# Patient Record
Sex: Female | Born: 1970 | Race: Black or African American | Hispanic: No | Marital: Married
Health system: Southern US, Community
[De-identification: ages and names within clinical notes are randomized; demographics above are authoritative.]

---

## 2007-03-01 ENCOUNTER — Emergency Department (HOSPITAL_COMMUNITY): Admission: EM | Admit: 2007-03-01 | Discharge: 2007-03-01 | Payer: Self-pay | Admitting: Emergency Medicine

## 2007-06-10 ENCOUNTER — Ambulatory Visit: Payer: Self-pay | Admitting: Internal Medicine

## 2007-06-10 ENCOUNTER — Encounter (INDEPENDENT_AMBULATORY_CARE_PROVIDER_SITE_OTHER): Payer: Self-pay | Admitting: Nurse Practitioner

## 2007-06-10 DIAGNOSIS — J019 Acute sinusitis, unspecified: Secondary | ICD-10-CM

## 2007-06-10 DIAGNOSIS — H6120 Impacted cerumen, unspecified ear: Secondary | ICD-10-CM

## 2007-06-10 DIAGNOSIS — M1710 Unilateral primary osteoarthritis, unspecified knee: Secondary | ICD-10-CM

## 2007-06-20 ENCOUNTER — Ambulatory Visit: Payer: Self-pay | Admitting: Internal Medicine

## 2007-06-20 LAB — CONVERTED CEMR LAB
AST: 9 units/L (ref 0–37)
Albumin: 4.2 g/dL (ref 3.5–5.2)
Alkaline Phosphatase: 59 units/L (ref 39–117)
Bilirubin Urine: NEGATIVE
Glucose, Urine, Semiquant: NEGATIVE
HDL: 47 mg/dL (ref >39)
Ketones, urine, test strip: NEGATIVE
LDL Cholesterol: 90 mg/dL (ref 0–99)
Potassium: 3.8 meq/L (ref 3.5–5.3)
Sodium: 138 meq/L (ref 135–145)
Specific Gravity, Urine: 1.01
TSH: 1.802 microintl units/mL (ref 0.350–5.50)
Total Bilirubin: 0.7 mg/dL (ref 0.3–1.2)
Total Protein: 7.4 g/dL (ref 6.0–8.3)
VLDL: 17 mg/dL (ref 0–40)

## 2007-07-04 ENCOUNTER — Ambulatory Visit: Payer: Self-pay | Admitting: Internal Medicine

## 2007-07-04 DIAGNOSIS — K2990 Gastroduodenitis, unspecified, without bleeding: Secondary | ICD-10-CM

## 2007-07-04 DIAGNOSIS — K2970 Gastritis, unspecified, without bleeding: Secondary | ICD-10-CM | POA: Insufficient documentation

## 2007-11-14 ENCOUNTER — Ambulatory Visit: Payer: Self-pay | Admitting: Nurse Practitioner

## 2007-11-14 DIAGNOSIS — H5710 Ocular pain, unspecified eye: Secondary | ICD-10-CM | POA: Insufficient documentation

## 2007-11-14 DIAGNOSIS — K219 Gastro-esophageal reflux disease without esophagitis: Secondary | ICD-10-CM

## 2007-11-14 LAB — CONVERTED CEMR LAB
ALT: <8 units/L (ref 0–35)
AST: 7 units/L (ref 0–37)
Albumin: 4.3 g/dL (ref 3.5–5.2)
Alkaline Phosphatase: 44 units/L (ref 39–117)
Basophils Absolute: 0 10*3/uL (ref 0.0–0.1)
Beta hcg, urine, semiquantitative: POSITIVE
Eosinophils Absolute: 0 10*3/uL (ref 0.0–0.7)
Eosinophils Relative: 0 % (ref 0–5)
Glucose, Bld: 87 mg/dL (ref 70–99)
Lymphs Abs: 1.3 10*3/uL (ref 0.7–4.0)
MCV: 83.9 fL (ref 78.0–100.0)
Neutrophils Relative %: 61 % (ref 43–77)
Platelets: 157 10*3/uL (ref 150–400)
Potassium: 4.7 meq/L (ref 3.5–5.3)
RDW: 13.8 % (ref 11.5–15.5)
Sodium: 137 meq/L (ref 135–145)
Total Bilirubin: 0.5 mg/dL (ref 0.3–1.2)
Total Protein: 7.5 g/dL (ref 6.0–8.3)
WBC: 4 10*3/uL (ref 4.0–10.5)
hCG, Beta Chain, Quant, S: 88093.5 milliintl units/mL

## 2007-11-19 ENCOUNTER — Telehealth (INDEPENDENT_AMBULATORY_CARE_PROVIDER_SITE_OTHER): Payer: Self-pay | Admitting: Nurse Practitioner

## 2007-11-21 ENCOUNTER — Telehealth (INDEPENDENT_AMBULATORY_CARE_PROVIDER_SITE_OTHER): Payer: Self-pay | Admitting: Nurse Practitioner

## 2007-11-28 ENCOUNTER — Inpatient Hospital Stay (HOSPITAL_COMMUNITY): Admission: AD | Admit: 2007-11-28 | Discharge: 2007-11-28 | Payer: Self-pay | Admitting: Obstetrics and Gynecology

## 2007-11-29 ENCOUNTER — Ambulatory Visit (HOSPITAL_COMMUNITY): Admission: AD | Admit: 2007-11-29 | Discharge: 2007-11-29 | Payer: Self-pay | Admitting: Obstetrics and Gynecology

## 2007-11-29 ENCOUNTER — Encounter (INDEPENDENT_AMBULATORY_CARE_PROVIDER_SITE_OTHER): Payer: Self-pay | Admitting: Obstetrics and Gynecology

## 2008-05-08 ENCOUNTER — Encounter (INDEPENDENT_AMBULATORY_CARE_PROVIDER_SITE_OTHER): Payer: Self-pay | Admitting: Obstetrics and Gynecology

## 2008-05-08 ENCOUNTER — Ambulatory Visit (HOSPITAL_COMMUNITY): Admission: RE | Admit: 2008-05-08 | Discharge: 2008-05-08 | Payer: Self-pay | Admitting: Obstetrics and Gynecology

## 2009-01-08 ENCOUNTER — Ambulatory Visit: Payer: Self-pay | Admitting: Nurse Practitioner

## 2009-01-08 DIAGNOSIS — R05 Cough: Secondary | ICD-10-CM

## 2009-01-08 DIAGNOSIS — E669 Obesity, unspecified: Secondary | ICD-10-CM | POA: Insufficient documentation

## 2009-01-08 DIAGNOSIS — L989 Disorder of the skin and subcutaneous tissue, unspecified: Secondary | ICD-10-CM | POA: Insufficient documentation

## 2009-01-11 ENCOUNTER — Ambulatory Visit: Payer: Self-pay | Admitting: *Deleted

## 2009-01-27 ENCOUNTER — Ambulatory Visit: Payer: Self-pay | Admitting: Nurse Practitioner

## 2009-01-27 DIAGNOSIS — N926 Irregular menstruation, unspecified: Secondary | ICD-10-CM

## 2009-01-27 DIAGNOSIS — M545 Low back pain: Secondary | ICD-10-CM

## 2009-01-27 LAB — CONVERTED CEMR LAB
Basophils Absolute: 0 10*3/uL (ref 0.0–0.1)
Basophils Relative: 1 % (ref 0–1)
Eosinophils Absolute: 0.4 10*3/uL (ref 0.0–0.7)
Eosinophils Relative: 7 % — ABNORMAL HIGH (ref 0–5)
HCT: 37 % (ref 36.0–46.0)
Hemoglobin: 12.7 g/dL (ref 12.0–15.0)
MCHC: 34.3 g/dL (ref 30.0–36.0)
MCV: 84.5 fL (ref 78.0–100.0)
Monocytes Absolute: 0.4 10*3/uL (ref 0.1–1.0)
Platelets: 152 10*3/uL (ref 150–400)
RDW: 12.7 % (ref 11.5–15.5)

## 2009-01-28 ENCOUNTER — Encounter (INDEPENDENT_AMBULATORY_CARE_PROVIDER_SITE_OTHER): Payer: Self-pay | Admitting: Nurse Practitioner

## 2009-04-22 ENCOUNTER — Ambulatory Visit: Payer: Self-pay | Admitting: Nurse Practitioner

## 2009-04-22 LAB — CONVERTED CEMR LAB
Bilirubin Urine: NEGATIVE
Nitrite: NEGATIVE
Protein, U semiquant: 30
Specific Gravity, Urine: 1.015
Urobilinogen, UA: 1
pH: 7

## 2009-04-26 ENCOUNTER — Ambulatory Visit: Payer: Self-pay | Admitting: Nurse Practitioner

## 2009-04-28 ENCOUNTER — Ambulatory Visit: Payer: Self-pay | Admitting: Nurse Practitioner

## 2009-05-14 ENCOUNTER — Encounter (INDEPENDENT_AMBULATORY_CARE_PROVIDER_SITE_OTHER): Payer: Self-pay | Admitting: Nurse Practitioner

## 2009-06-14 ENCOUNTER — Telehealth (INDEPENDENT_AMBULATORY_CARE_PROVIDER_SITE_OTHER): Payer: Self-pay | Admitting: Nurse Practitioner

## 2009-07-07 ENCOUNTER — Encounter (INDEPENDENT_AMBULATORY_CARE_PROVIDER_SITE_OTHER): Payer: Self-pay | Admitting: Nurse Practitioner

## 2010-08-24 ENCOUNTER — Emergency Department (HOSPITAL_COMMUNITY): Admission: EM | Admit: 2010-08-24 | Discharge: 2010-08-24 | Payer: Self-pay | Admitting: Emergency Medicine

## 2011-02-28 NOTE — Op Note (Signed)
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   OPERATION:  Cold-knife conization of the cervix.   SURGEON:  Hal Morales, MD   ANESTHESIA:  General orotracheal.   ESTIMATED BLOOD LOSS:  Less than 10 mL.   COMPLICATIONS:  None.   FINDINGS:  The cervix appeared normal.  The uterus sounded to 7 cm.  There was a minimal Lugol nonstaining area at 12 o'clock at the external  os.   SPECIMENS TO PATHOLOGY:  Cold-knife cone, endocervical curettings, and  endometrial curettings.   PROCEDURE:  The patient was taken to the operating room after  appropriate identification and after a discussion of the risks and  benefits as well as indications of the procedure via the [place]  language line with interpreter number [phone].  She was placed on the  operating table and after the attainment of adequate general anesthesia  was placed in the lithotomy position.  The perineum and vagina were  prepped with multiple layers of Betadine and the bladder was emptied  with a straight catheter.  The perineum was draped as a sterile field.  A Graves speculum was placed in the vagina and a paracervical block  achieved with a total of 10 mL of 2% Xylocaine in the 5 and 7 o'clock  positions.  A single-tooth tenaculum was placed outside the transition  zone.  Lugol staining was undertaken with the above-noted findings.  Hemostatic sutures were placed at the 3 and 9 o'clock position and held.  The cervical cone was then excised including the  small nonstaining area  at 12 o'clock and the endocervical canal.  The cone was removed in 2  pieces.  A suture was placed at that 12 o'clock position for  identification and once the cone was removed and seen to be disrupted, 2  sutures were placed to reconnect the endocervical canal.  The  endocervical canal was then curetted and sent as a specimen.  The  endometrial canal was curetted in all quadrants and sent as a separate  specimen.  Sturmdorf sutures were placed at the 12 and 6 o'clock  positions.  Hemostasis was noted to be adequate.  A portion of Gelfoam  was placed in the conization bed.  The patient was awakened from general  anesthesia and taken to the recovery room in satisfactory condition  having tolerated the procedure well with sponge and instrument counts  correct.  Specimens to pathology were cold-knife cone, ECC, and  endometrial curettings.   DISCHARGE INSTRUCTIONS:  Home care instructions for conization from  [facility].   DISCHARGE MEDICATIONS:  1. Tylenol, over the counter, 650 mg q.4  h. p.r.n. pain.  2. Vicodin 1 to 2 p.o. q.4 h. p.r.n. severe pain.   FOLLOWUP INSTRUCTIONS:  The patient is to follow up in 2 weeks on June 03, 2008, at 3:00 p.m. with Dr. Pennie Rushing.      Hal Morales, M.D.  Electronically Signed     VPH/MEDQ  D:  05/08/2008  T:  05/09/2008  Job:  [phone]

## 2011-02-28 NOTE — Op Note (Signed)
Kaitlin Owens, Kaitlin Owens              ACCOUNT NO.:  [id number]   MEDICAL RECORD NO.:  [id number]          PATIENT TYPE:  MAT   LOCATION:  MATC                          [facility]  [facility]   PHYSICIAN:  Crist Fat. Rivard, M.D. DATE OF BIRTH:  11-Dec-1970   DATE OF PROCEDURE:  11/29/2007  DATE OF DISCHARGE:  11/29/2007                               OPERATIVE REPORT   PREOPERATIVE DIAGNOSIS:  Incomplete abortion.   POSTOPERATIVE DIAGNOSIS:  Incomplete abortion.   ANESTHESIA:  IV sedation and paracervical block, Dr. Harvest Forest, Dr. Estanislado Pandy.   PROCEDURE:  Dilatation and evacuation.   ESTIMATED BLOOD LOSS:  Minimal.   PROCEDURE:  Through an interpreter line, the patient was consented for a  D&E with risks and benefits reviewed and questions were answered.  She  was brought to the OR in room #3, placed in a lithotomy position,  prepped and draped in a sterile fashion after IV sedation was provided  to the patient.  Her bladder was emptied with an in-and-out Foley  catheter.  Pelvic exam reveals an anteverted uterus 9-10 weeks in size  with a flared cervical os two normal adnexa.   A weighted speculum is inserted.  Anterior lip of the cervix is grasped  with a tenaculum forceps and paracervical block is performed with 20 mL  of Novocain 1% in the usual fashion.  The cervix is dilated to easily  allow a Hegar dilator #35 and the uterus is sounded at 9 cm.  Using a #9  curved cannula we proceed with evacuation of products of conception from  the uterine cavity.  When the evacuation is complete a sharp curette is  used to assess the uterine cavity, which is now felt to be free of  tissue.   Instruments are removed, instrument and sponge count is complete x2, end  estimated blood loss is minimal.  The procedure is very well-tolerated  by the patient, who is taken to recovery room in a well and stable  condition.   SPECIMEN:  Products of conception sent to pathology.      Crist Fat Rivard, M.D.  Electronically Signed     SAR/MEDQ  D:  11/29/2007  T:  12/02/2007  Job:  [id number]

## 2011-02-28 NOTE — H&P (Signed)
Kaitlin, Owens              ACCOUNT NO.:  [id number]   MEDICAL RECORD NO.:  [id number]          PATIENT TYPE:  AMB   LOCATION:  SDC                           [facility]   PHYSICIAN:  Hal Morales, M.D.DATE OF BIRTH:  [birth year]   DATE OF ADMISSION:  DATE OF DISCHARGE:                              HISTORY & PHYSICAL   HISTORY OF PRESENT ILLNESS:  The patient is a 40 year old black female  para 9-2-0-1-7, who presents for evaluation and management of abnormal  genital cytology.  The patient had a Pap smear on February 03, 2008 that  showed epithelial cell abnormality with glandular cells, atypical  glandular cells not otherwise specified.  The patient underwent  colposcopically directed biopsies and endometrial biopsies showing an  exocervical biopsy at 12 o'clock with chronic cervicitis and reactive  changes and endocervical curetting with a small fragment of cervical  squamous mucosa with low grade squamous intraepithelial lesion, mild  dysplasia and endometrium with benign proliferative endometrium without  hyperplasia, atypia or malignancy.  The patient presents for conization  for this disparity.   PAST HISTORY:  GYNECOLOGIC: The patient underwent menarche at age 61 and  has menses every month lasting for about 5 days.  She had her last  menstrual period January 18, 2008 at which time an Implanon insertion was  done for contraception.  She had previously used Depo-Provera for  contraception.   OBSTETRICAL HISTORY:  The patient has seven living children, all from  vaginal delivery and has had two miscarriages, the most recent in  February 2009.   SURGICAL HISTORY:  D&C for spontaneous abortion.   FAMILY HISTORY:  Is negative.   MEDICAL HISTORY:  Is negative.   CURRENT MEDICATIONS:  None.   DRUG SENSITIVITIES:  None.   REVIEW OF SYSTEMS:  Is negative except as mentioned above.   PHYSICAL EXAMINATION:  The patient is a well-developed black female in  no acute  distress. Temperature 98.7, blood pressure 132/92, pulse 72,  respirations 14.  LUNGS:  Clear.  HEART: Regular rate and rhythm.  ABDOMEN: The abdomen is soft without masses or organomegaly.  EXTREMITIES:  No clubbing, cyanosis or edema.  PELVIC:  EG, BUS the last within normal limits.  The vagina is rugous.  The cervix is without gross lesions.  The uterus is normal size, shape,  consistency, mobile and nontender.  Adnexae no masses.  Rectovaginal no  masses.   IMPRESSION:  Abnormal genital cytology with a positive endocervical  curettage.   DISPOSITION:  A discussion is held with the patient via her Swahili  interpreter concerning the recommendation for cold knife conization of  the cervix D&C.  She wishes to proceed.  The risks of anesthesia,  bleeding, infection and damage to adjacent organs were explained via the  interpreter and this will be performed at [facility] on May 08, 2008.      Hal Morales, M.D.  Electronically Signed     VPH/MEDQ  D:  04/23/2008  T:  04/23/2008  Job:  [id number]

## 2011-02-28 NOTE — H&P (Signed)
Kaitlin, Owens              ACCOUNT NO.:  [id number]   MEDICAL RECORD NO.:  [id number]          PATIENT TYPE:  MAT   LOCATION:  MATC                          [facility]   PHYSICIAN:  Crist Fat. Rivard, M.D. DATE OF BIRTH:  08-Mar-1971   DATE OF ADMISSION:  11/29/2007  DATE OF DISCHARGE:                              HISTORY & PHYSICAL   The patient is a 40 year old married black female, gravida 9, para 8, 1-  0-7 at 79 and 4/7 weeks per an LMP of September 09, 2007, who presents  with onset of heavy vaginal bleeding this morning and increased lower  abdominal pain.  She reports having multiple clots.  She has no other  pain with the exception of a headache, which she had also had yesterday.  She denies any nausea, vomiting, diarrhea, UTI signs or symptoms, fever,  shortness of breath.  She last had solid food intake at approximately  8:00 p.m. last night as well as liquids at that time.  She reports that  she has had decreased appetite.  She was seen here at [facility] in the maternity admission unit yesterday for some spotting  and lower abdominal pain and was sent home after giving her a Vicodin  tablet x1.  She was scheduled for a D&E tomorrow, February 14, secondary  to a missed AB.  She had a new OB visit in the office, where the missed  AB was identified.  She had an ultrasound at that time showing no fetal  heart rate, crown rump length of 7-3/7 weeks and a gestational sac  measuring 9-1/7 weeks and irregular.  Her history is remarkable for, 1)  language barrier; she speaks Swahili.  She moved to the [place]  approximately one year ago, 2) advanced maternal age, 3) grand multip,  4) history of a neonatal death with one of her pregnancies; it was a  twin gestation with death of one of the twins at 38 day old, 5) history  of a questionable IUFD at 6 months versus preterm delivery and preterm  labor, 6) questionable rheumatoid arthritis.  The patient is  desiring  contraception, and she presented to the hospital via EMS with her  husband and one of their children.   PRENATAL LABORATORIES:  The patient's blood type is A positive, Rh  antibody screen negative, GC and chlamydia cultures negative, hepatitis  surface antigen negative, RPR nonreactive, rubella immune.  Her CBC on  February 10 showed a white count of 4.5, hemoglobin 12.1, hematocrit  35.7, platelets 150.  She is varicella immune.  HIV was nonreactive.  Her hemoglobin electrophoresis is pending.  The patient's CBC on  admission today:  White count was 5.5, hemoglobin 12.5, hematocrit 36,  and platelets were 139.   OBSTETRICAL HISTORY:  The patient had a spontaneous vaginal delivery  November 1990, female, unknown weight, term.  Had a spontaneous vaginal  delivery, G2, in May of 1992, unknown weight, term per patient.  The  patient was gravida 3 with twin gestation and spontaneous vaginal  delivery in April 1994.  One of the  twins died at 76 day old.  Gravida 4  was spontaneous vaginal delivery December 1998, unknown sex and weight.  Gravida 5 spontaneous vaginal delivery April 2000, again unknown weight  and sex.  Gravida 6 was spontaneous vaginal delivery in June of 2002,  unknown weight and sex.  Gravida 7 spontaneous vaginal delivery, female  infant, probably term in November 2006.  She has had one other pregnancy  that was either a preterm delivery at 6 months or an IUFD, and then  gravida 9 is the current pregnancy.   PAST MEDICAL HISTORY:  The patient with anemia with all her pregnancies.  As mentioned, had a 17-day-old twin that died in 25.  She reports  postpartum hemorrhage with her first and fourth deliveries, questionable  possible preterm rupture of membranes with her second pregnancy.  She  has used Depo-Provera in the past, last injection was in 2006.  Varicella:  She was not sure that she had had it but she is immune.  She  reports seeing a physician in [place]  who told her that she may have  rheumatoid arthritis secondary to joint pain for almost 5 years.   FAMILY HISTORY:  Unknown.   GENETIC HISTORY:  Remarkable for patient being advanced maternal age at  41 years of age.  Father of the baby 37 years old.   SOCIAL HISTORY:  Married black female, speaking Swahili.  They report  Pentecostal faith.  She is a Futures trader.  The father of the baby reports  high school education, works for [employer] full-time.  The patient with  a 7th grade education.  She denies alcohol ,tobacco or illicit drug use.   HISTORY OF PRESENT PREGNANCY:  The patient seen on the 10th in the  office for a new OB visit, where a missed AB was identified.  She then  came into MAU on the 12th with some spotting and some lower abdominal  pain, which improved after Vicodin, and the patient was discharged home  with bleeding precautions and miscarriage signs and symptoms.  She  returned today, on the 13th of February, with a large amount of vaginal  bleeding and abdominal pain, and she presented EMS.   PHYSICAL EXAMINATION:  VITAL SIGNS ON ADMISSION:  Blood pressure 137/74,  pulse 110, respirations 18, temperature 98.2.  SKIN:  Warm, dry and intact.  HEENT:  Within normal limits.  CARDIOVASCULAR:  Regular rate and rhythm without murmur.  RESPIRATORY:  Lungs were clear to auscultation bilaterally.  ABDOMEN:  Soft.  Somewhat tender.  No guarding or rebound.  PELVIC:  Large amount of blood on speculum exam with several 4 to 5 cm  clots with some stringiness.  After that was cleared from the vault,  there was no active bleeding at the os, and the cervix was approximately  1 cm dilated.  On bimanual exam, no cervical motion tenderness.  EXTREMITIES:  Within normal limits.   The patient had an ultrasound after admission to the hospital showing no  intrauterine pregnancy visualized, heterogeneous material in endometrial  cavity with some internal blood flow suspicious for retained  products of  conception.  No adnexal mass or free fluid identified.   IMPRESSION:  1. Vaginal bleeding , status post missed abortion.  2. Pain in the lower abdomen.  3. An 11-4/7-week gestation.   PLAN:  Consulted with Dr. Estanislado Pandy regarding the patient's status and plan  to proceed with D&E secondary to possible retained products of  conception in the uterus status  post ultrasound.  The patient did have  an IV started in MAU.  She had a 500-mL LR bolus and it has been running  at 125 an hour.  She has been NPO since admission.  The patient was  consented for the procedure using the language line and was prepped for  OR.      Candice [place], CNM      Dois Davenport A. Rivard, M.D.  Electronically Signed   CHS/MEDQ  D:  11/29/2007  T:  11/29/2007  Job:  [id number]

## 2011-03-01 ENCOUNTER — Ambulatory Visit (HOSPITAL_COMMUNITY)
Admission: RE | Admit: 2011-03-01 | Discharge: 2011-03-01 | Disposition: A | Discharge status: Home / Self Care | Payer: Self-pay | Source: Ambulatory Visit | Attending: Internal Medicine | Admitting: Internal Medicine

## 2011-03-01 ENCOUNTER — Other Ambulatory Visit (HOSPITAL_COMMUNITY): Payer: Self-pay | Admitting: Family Medicine

## 2011-03-01 ENCOUNTER — Other Ambulatory Visit: Payer: Self-pay | Admitting: Internal Medicine

## 2011-03-01 DIAGNOSIS — R52 Pain, unspecified: Secondary | ICD-10-CM

## 2011-03-01 DIAGNOSIS — R05 Cough: Secondary | ICD-10-CM

## 2011-03-01 DIAGNOSIS — R059 Cough, unspecified: Secondary | ICD-10-CM | POA: Insufficient documentation

## 2011-07-07 LAB — CBC
HCT: 36
Hemoglobin: 12.5
MCHC: 34.6
Platelets: 139 — ABNORMAL LOW
RDW: 14

## 2011-07-14 LAB — CBC
MCHC: 32.7
MCV: 88.7
Platelets: 153
RBC: 4.89
WBC: 4.1

## 2011-07-14 LAB — PREGNANCY, URINE: Preg Test, Ur: NEGATIVE

## 2012-03-24 IMAGING — CR DG CHEST 2V
2 series · 2 of 2 positions shown · non-contrast
Comparison: 08/24/2010

CLINICAL DATA: Cough

CHEST - 2 VIEW

[w chest pa]
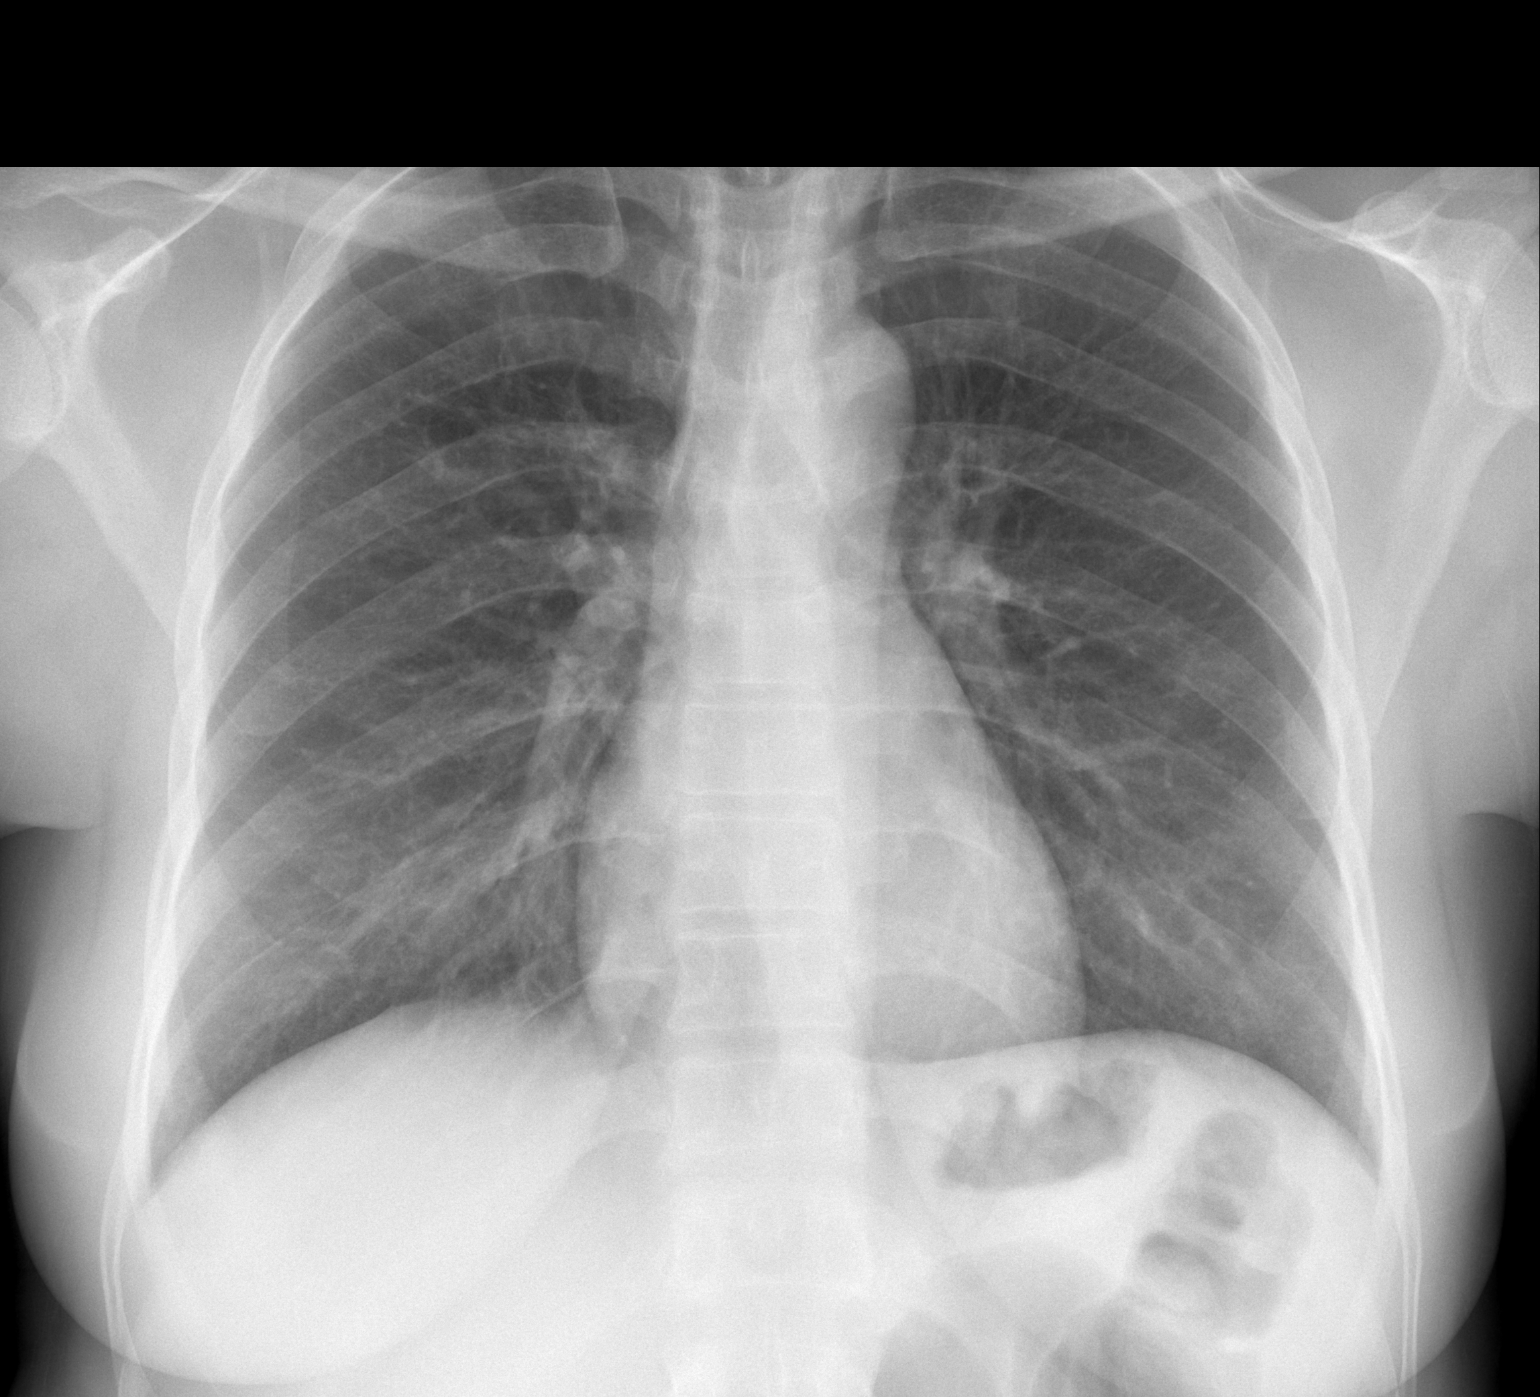

[w chest lat]
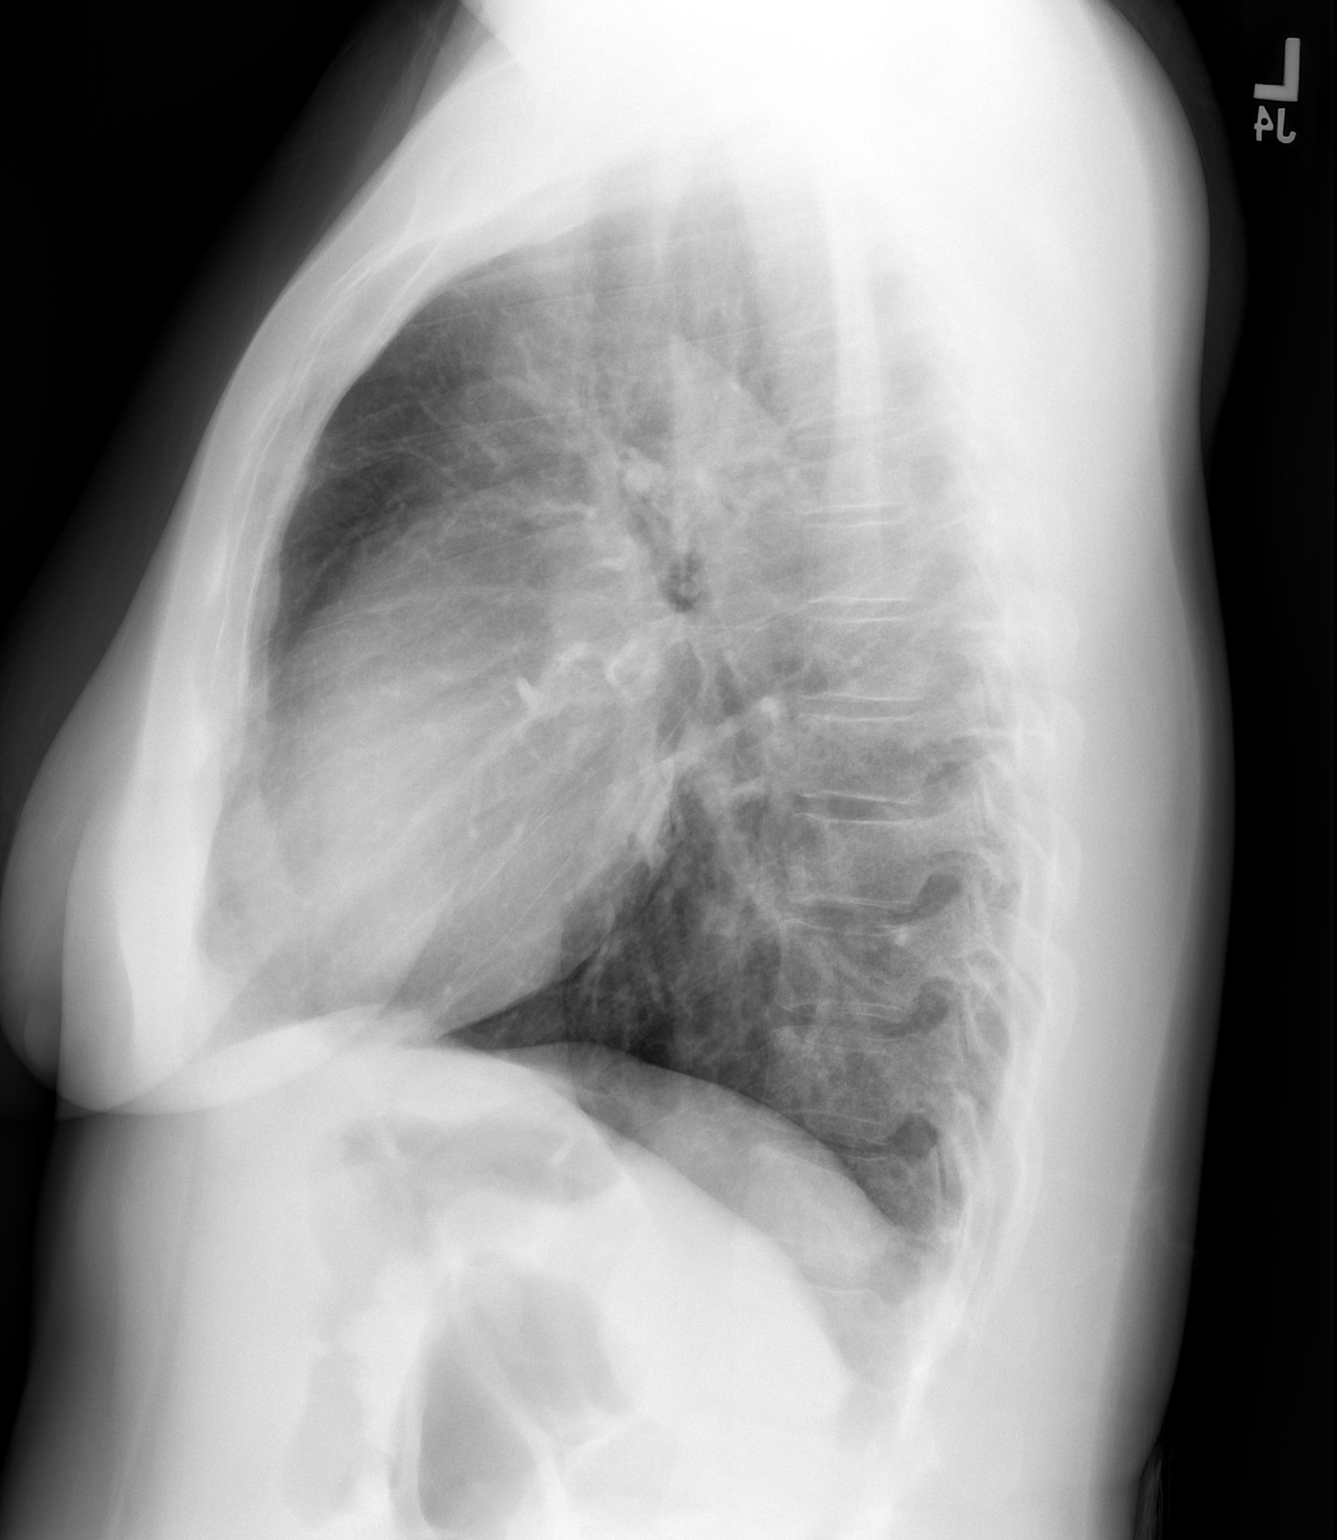

[2 of 2 positions shown; findings below may reference images not displayed]

FINDINGS: Heart size is normal.  Mediastinal shadows are normal.
The lungs are clear. There may be mild bronchial thickening.  No
infiltrate, collapse or effusion.  No significant bony finding.
IMPRESSION: No infiltrate.  Possible bronchitis.

## 2021-02-12 ENCOUNTER — Other Ambulatory Visit: Payer: Self-pay

## 2021-02-12 ENCOUNTER — Ambulatory Visit (INDEPENDENT_AMBULATORY_CARE_PROVIDER_SITE_OTHER): Payer: Medicaid Other | Admitting: Family Medicine

## 2021-02-12 DIAGNOSIS — F4310 Post-traumatic stress disorder, unspecified: Secondary | ICD-10-CM

## 2021-02-12 NOTE — Progress Notes (Signed)
Patient was seen and examined for N-648.  °Rosland Riding, MD  °Family Medicine Teaching Service  ° °
